# Patient Record
Sex: Female | Born: 1975 | Race: White | Hispanic: No | Marital: Single
Health system: Southern US, Community
[De-identification: ages and names within clinical notes are randomized; demographics above are authoritative.]

---

## 2020-09-23 ENCOUNTER — Emergency Department (HOSPITAL_COMMUNITY): Payer: Medicaid Other

## 2020-09-23 ENCOUNTER — Emergency Department (HOSPITAL_COMMUNITY)
Admission: EM | Admit: 2020-09-23 | Discharge: 2020-09-23 | Disposition: A | Discharge status: Home / Self Care | Payer: Medicaid Other | Attending: Emergency Medicine | Admitting: Emergency Medicine

## 2020-09-23 DIAGNOSIS — Z23 Encounter for immunization: Secondary | ICD-10-CM | POA: Diagnosis not present

## 2020-09-23 DIAGNOSIS — S9031XA Contusion of right foot, initial encounter: Secondary | ICD-10-CM | POA: Diagnosis not present

## 2020-09-23 DIAGNOSIS — W228XXA Striking against or struck by other objects, initial encounter: Secondary | ICD-10-CM | POA: Diagnosis not present

## 2020-09-23 DIAGNOSIS — Y92009 Unspecified place in unspecified non-institutional (private) residence as the place of occurrence of the external cause: Secondary | ICD-10-CM | POA: Diagnosis not present

## 2020-09-23 DIAGNOSIS — S99921A Unspecified injury of right foot, initial encounter: Secondary | ICD-10-CM | POA: Diagnosis present

## 2020-09-23 MED ORDER — TETANUS-DIPHTH-ACELL PERTUSSIS 5-2.5-18.5 LF-MCG/0.5 IM SUSY
0.5000 mL | PREFILLED_SYRINGE | Freq: Once | INTRAMUSCULAR | Status: AC
  Administered 2020-09-23: 0.5 mL via INTRAMUSCULAR
  Filled 2020-09-23: qty 0.5

## 2020-09-23 MED ORDER — ONDANSETRON HCL 4 MG/2ML IJ SOLN
4.0000 mg | Freq: Once | INTRAMUSCULAR | Status: AC
  Administered 2020-09-23: 4 mg via INTRAVENOUS
  Filled 2020-09-23: qty 2

## 2020-09-23 MED ORDER — KETOROLAC TROMETHAMINE 15 MG/ML IJ SOLN
15.0000 mg | Freq: Once | INTRAMUSCULAR | Status: AC
  Administered 2020-09-23: 15 mg via INTRAVENOUS
  Filled 2020-09-23: qty 1

## 2020-09-23 MED ORDER — KETAMINE HCL 50 MG/5ML IJ SOSY
0.3000 mg/kg | PREFILLED_SYRINGE | Freq: Once | INTRAMUSCULAR | Status: AC
  Administered 2020-09-23: 31 mg via INTRAVENOUS
  Filled 2020-09-23: qty 5

## 2020-09-23 NOTE — ED Provider Notes (Signed)
[facility] EMERGENCY DEPARTMENT Provider Note   CSN: [id number] Arrival date & time: 09/23/20  1842     History Chief Complaint  Patient presents with  . Foot Injury    Kristie Powell is a 45 y.o. female.  The history is provided by the patient.  Foot Injury Location:  Foot Time since incident:  2 hours Injury: yes   Mechanism of injury comment:  Patient was standing near a generator that some was pulling to try to start when a piece broke off and hit her in the right foot Foot location:  R foot Pain details:    Quality:  Shooting and throbbing   Radiates to:  Does not radiate   Severity:  Severe   Onset quality:  Sudden   Timing:  Constant   Progression:  Unchanged Chronicity:  New Foreign body present:  Unable to specify Tetanus status:  Out of date Prior injury to area:  No Relieved by:  None tried Worsened by:  Bearing weight and activity (Palpation) Ineffective treatments:  None tried Associated symptoms: decreased ROM and swelling   Risk factors comment:  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  There are no problems to display for this patient.      OB History   No obstetric history on file.     No family history on file.     Home Medications Prior to Admission medications   Not on File    Allergies    Fentanyl, Tramadol, and Vicodin hp [hydrocodone-acetaminophen]  Review of Systems   Review of Systems  All other systems reviewed and are negative.   Physical Exam Updated Vital Signs BP 96/81 (BP Location: Right Arm)   Pulse (!) 7   Temp (!) 97.5 F (36.4 C) (Oral)   Resp 15   SpO2 99%   Physical Exam Vitals and nursing note reviewed.  Constitutional:      General: She is not in acute distress.    Appearance: Normal appearance.  HENT:     Head: Normocephalic.     Mouth/Throat:     Mouth: Mucous membranes are moist.  Cardiovascular:     Rate and  Rhythm: Normal rate.     Pulses: Normal pulses.  Pulmonary:     Effort: Pulmonary effort is normal.  Musculoskeletal:        General: Tenderness and signs of injury present.     Right ankle: Swelling present. No tenderness. Decreased range of motion.       Feet:     Comments: Sensation is intact in all 5 toes on the right foot.  Slight decreased sensation in the lateral 3 toes but patient reports that is baseline due to her neuropathy.  She is able to wiggle the toes  Skin:    General: Skin is warm and dry.  Neurological:     Mental Status: She is alert and oriented to person, place, and time. Mental status is at baseline.  Psychiatric:        Mood and Affect: Mood normal.        Behavior: Behavior normal.     ED Results / Procedures / Treatments   Labs (all labs ordered are listed, but only abnormal results are displayed) Labs Reviewed - No data to display  EKG None  Radiology DG Ankle Complete Right  Result Date: 09/23/2020 CLINICAL DATA:  Right foot/ankle pain and swelling. EXAM: RIGHT ANKLE -  COMPLETE 3+ VIEW; RIGHT FOOT COMPLETE - 3+ VIEW COMPARISON:  None. FINDINGS: No acute fracture or dislocation. Small well corticated ossific fragment between the lateral talar process and lateral malleolus, consistent with prior trauma. The ankle mortise is symmetric. The talar dome is intact. No tibiotalar joint effusion joint spaces are preserved. Small dorsal navicular osteophyte. Os trigonum. Focal prominent dorsal forefoot soft tissue swelling. No radiopaque foreign body identified. IMPRESSION: 1. Focal prominent dorsal forefoot soft tissue swelling. No acute osseous abnormality of the right foot and ankle. Electronically Signed   By: Obie Dredge M.D.   On: 09/23/2020 19:50   DG Foot Complete Right  Result Date: 09/23/2020 CLINICAL DATA:  Right foot/ankle pain and swelling. EXAM: RIGHT ANKLE - COMPLETE 3+ VIEW; RIGHT FOOT COMPLETE - 3+ VIEW COMPARISON:  None. FINDINGS: No acute  fracture or dislocation. Small well corticated ossific fragment between the lateral talar process and lateral malleolus, consistent with prior trauma. The ankle mortise is symmetric. The talar dome is intact. No tibiotalar joint effusion joint spaces are preserved. Small dorsal navicular osteophyte. Os trigonum. Focal prominent dorsal forefoot soft tissue swelling. No radiopaque foreign body identified. IMPRESSION: 1. Focal prominent dorsal forefoot soft tissue swelling. No acute osseous abnormality of the right foot and ankle. Electronically Signed   By: Obie Dredge M.D.   On: 09/23/2020 19:50    Procedures Procedures   Medications Ordered in ED Medications  Tdap (BOOSTRIX) injection 0.5 mL (has no administration in time range)  ketorolac (TORADOL) 15 MG/ML injection 15 mg (has no administration in time range)    ED Course  I have reviewed the triage vital signs and the nursing notes.  Pertinent labs & imaging results that were available during my care of the patient were reviewed by me and considered in my medical decision making (see chart for details).    MDM Rules/Calculators/A&P                          Patient presenting today by EMS after an injury to her foot.  She is not sure if metal was embedded in her foot or if the wound is just from the impact.  Tetanus shot was updated here.  Because of patient's significant allergy to opiates resulting in vomiting she was given ketamine by EMS.  She is still having significant pain but appears neurovascularly intact.  Plain films are pending.  9:25 PM Patient with contusion to the right dorsal foot after injury tonight.  There is no evidence of foreign body.  The skin is more contused over the first metatarsal bone but no distinct deep laceration that needs repair at this time.  Tetanus shot was updated.  Patient was placed in a postop shoe.  She can use Tylenol and ibuprofen as needed for pain.  Patient has significant allergy to opiates  so received ketamine while she was here.  MDM Number of Diagnoses or Management Options   Amount and/or Complexity of Data Reviewed Tests in the radiology section of CPT: ordered and reviewed Independent visualization of images, tracings, or specimens: yes     Final Clinical Impression(s) / ED Diagnoses Final diagnoses:  Contusion of right foot, initial encounter    Rx / DC Orders ED Discharge Orders    None       Gwyneth Sprout, MD 09/23/20 2127

## 2020-09-23 NOTE — ED Notes (Signed)
Patient given discharge paperwork and instructions. Verbalized understanding of teaching. IV d/c with cath tip intact. Wheeled to exit in NAD.

## 2020-09-23 NOTE — Discharge Instructions (Signed)
The x-ray today was normal without any signs of metal in your foot and nothing is broken.  It is bruised very badly.  You will need to elevate it and use ice.  Use Tylenol and ibuprofen as needed for pain.

## 2020-09-23 NOTE — ED Triage Notes (Signed)
Pt was at home trying to start a pull start generator. Cord snapped and struck right foot. Unsure if metal is present in injury. Ketamine 20mg  IVP given by EMS.

## 2022-05-30 IMAGING — CR DG FOOT COMPLETE 3+V*R*
3 series · 3 of 3 positions shown · non-contrast
Comparison: None.

CLINICAL DATA: Right foot/ankle pain and swelling.

EXAM:
RIGHT ANKLE - COMPLETE 3+ VIEW; RIGHT FOOT COMPLETE - 3+ VIEW

[foot ap]
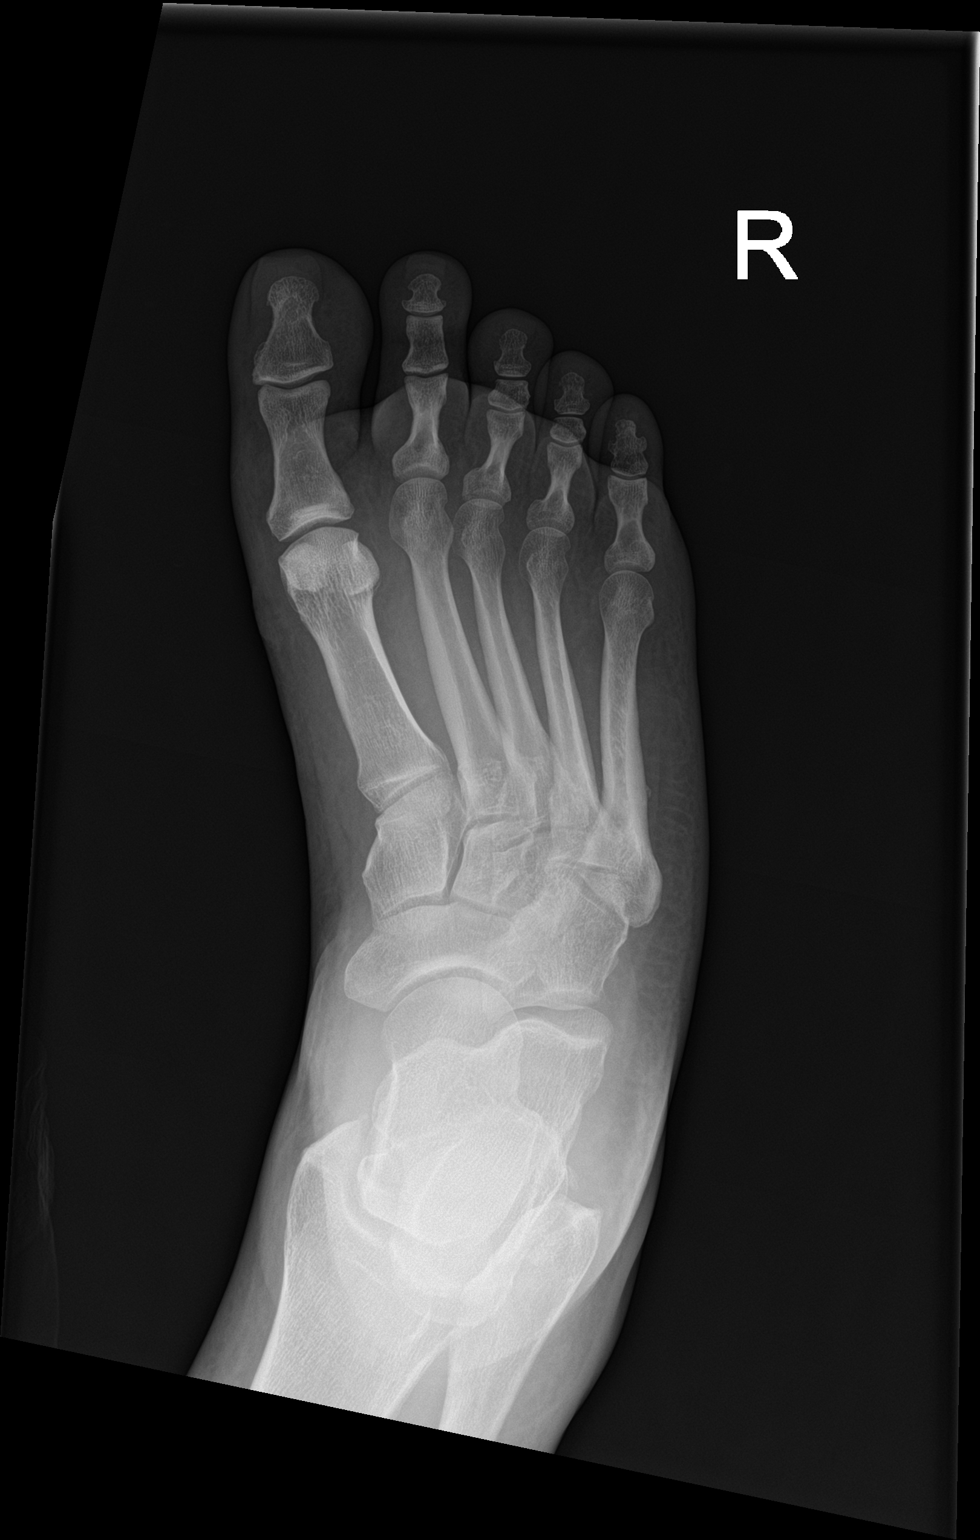

[foot obl]
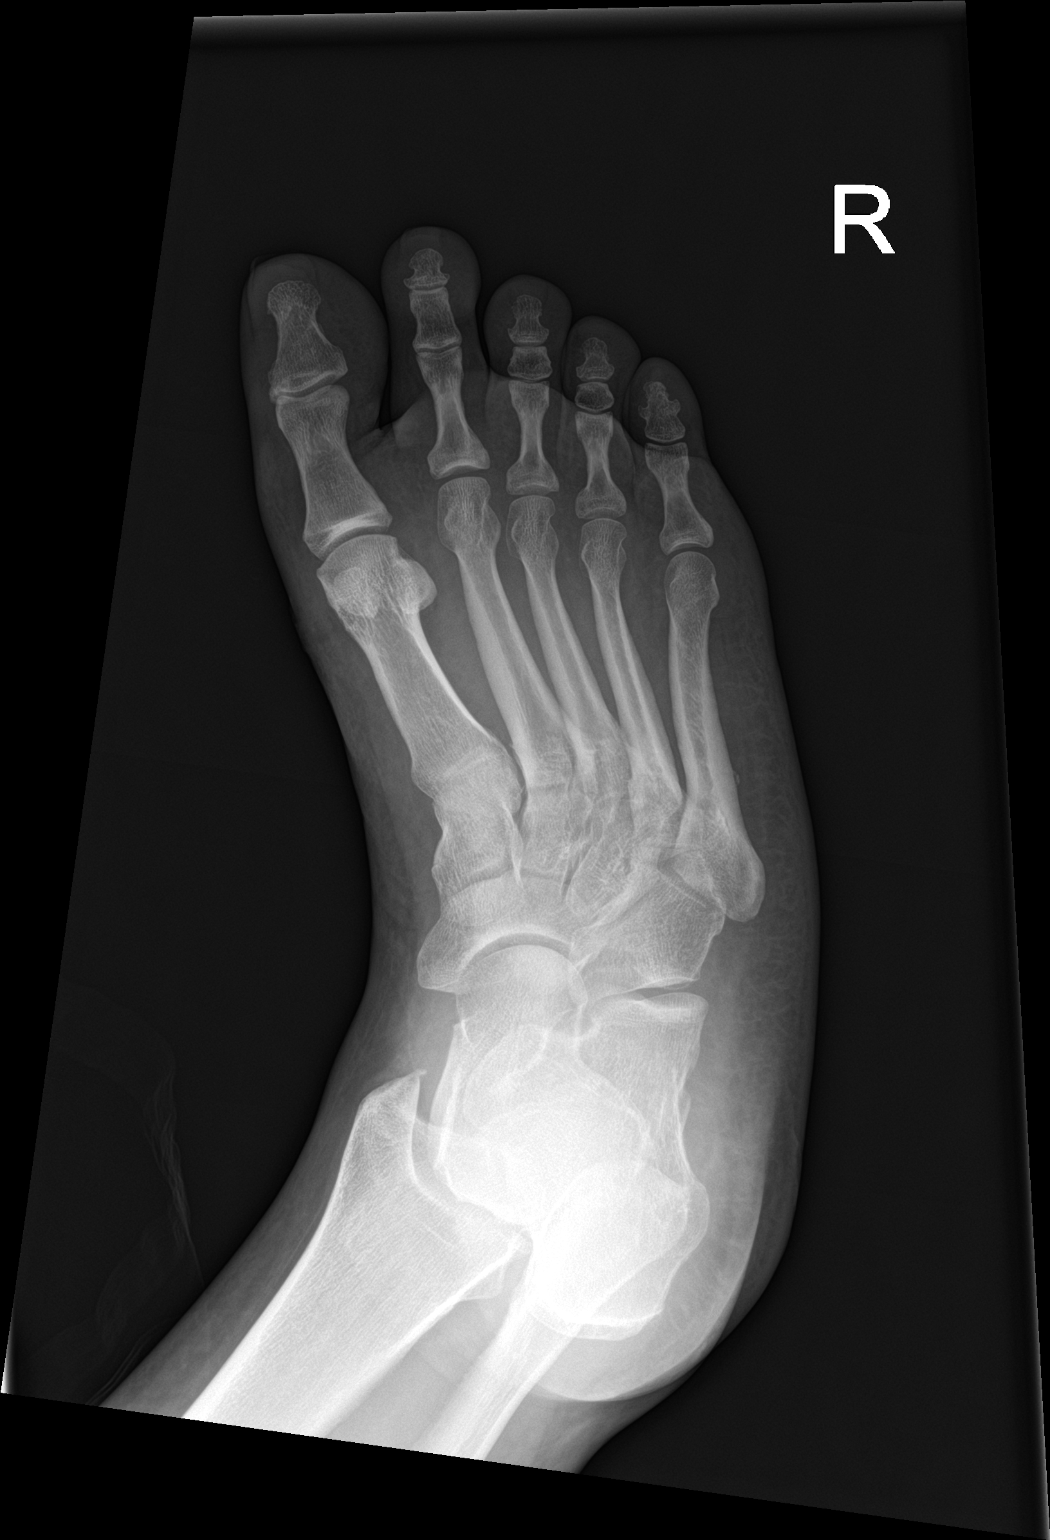

[foot lat]
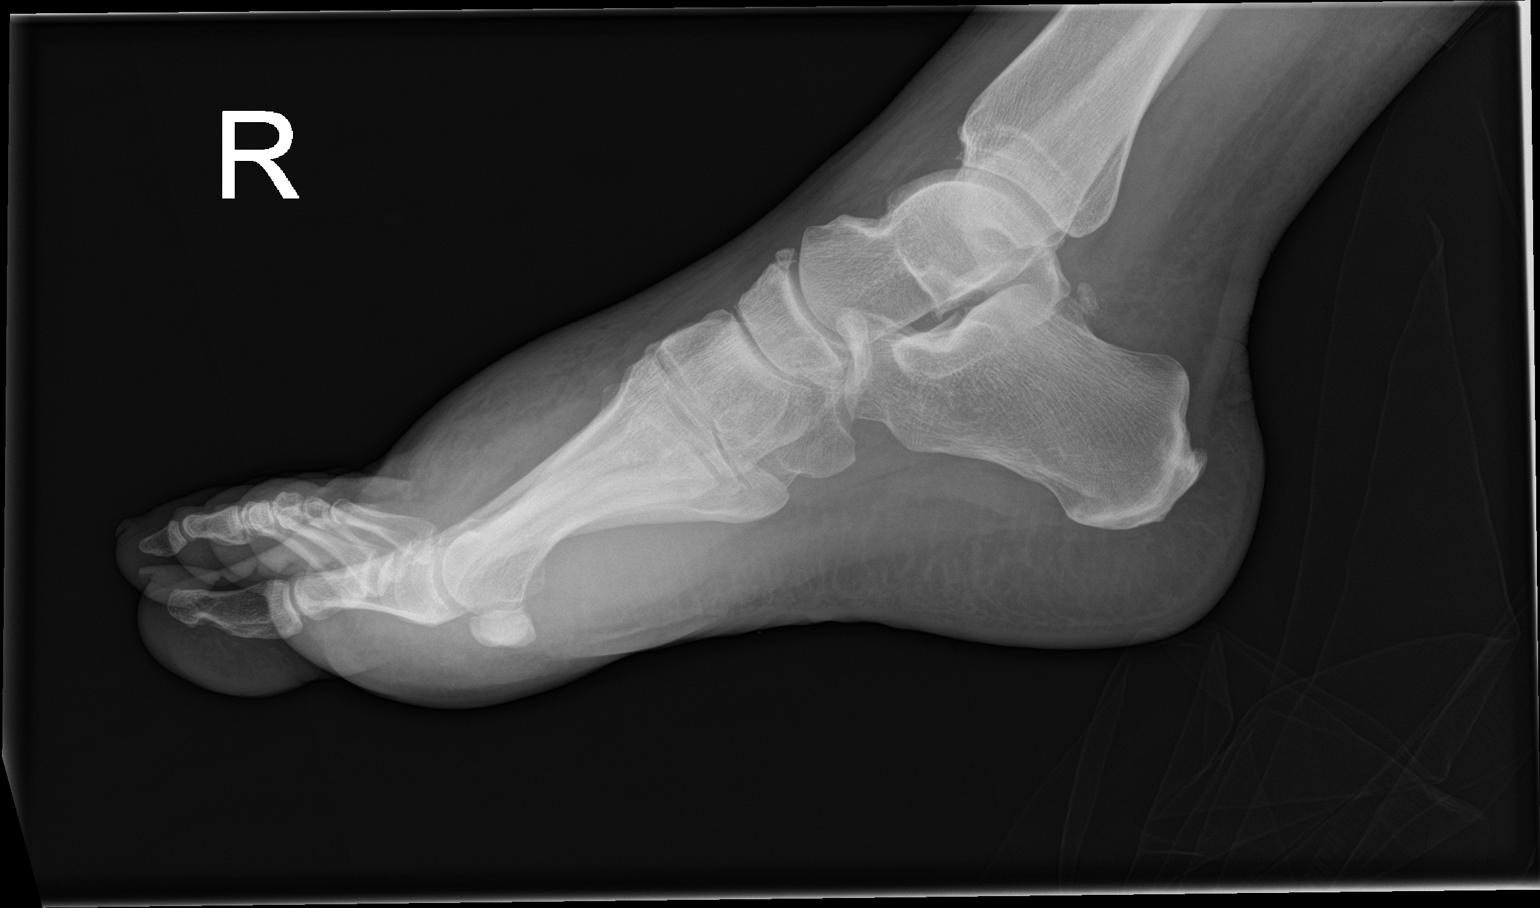

[3 of 3 positions shown; findings below may reference images not displayed]

FINDINGS: No acute fracture or dislocation. Small well corticated ossific
fragment between the lateral talar process and lateral malleolus,
consistent with prior trauma. The ankle mortise is symmetric. The
talar dome is intact. No tibiotalar joint effusion joint spaces are
preserved. Small dorsal navicular osteophyte. Os trigonum. Focal
prominent dorsal forefoot soft tissue swelling. No radiopaque
foreign body identified.
IMPRESSION: 1. Focal prominent dorsal forefoot soft tissue swelling. No acute
osseous abnormality of the right foot and ankle.
